# Patient Record
Sex: Male | Born: 1990 | Race: Black or African American | Hispanic: No | Marital: Single | Smoking: Never smoker
Health system: Southern US, Community
[De-identification: ages and names within clinical notes are randomized; demographics above are authoritative.]

## PROBLEM LIST (undated history)

## (undated) HISTORY — PX: APPENDECTOMY: SHX54

---
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
## 2011-01-27 LAB — BASIC METABOLIC PANEL
Calcium: 9.6 mg/dL (ref 8.4–10.5)
Potassium: 4.6 mEq/L (ref 3.5–5.1)
Sodium: 139 mEq/L (ref 135–145)

## 2011-01-27 LAB — CBC
HCT: 44 % (ref 36.0–49.0)
Hemoglobin: 14.6 g/dL (ref 12.0–16.0)
RBC: 5.38 MIL/uL (ref 3.80–5.70)
RDW: 15.1 % (ref 11.4–15.5)
WBC: 8.2 10*3/uL (ref 4.5–13.5)

## 2011-01-27 LAB — DIFFERENTIAL
Basophils Absolute: 0 10*3/uL (ref 0.0–0.1)
Lymphocytes Relative: 43 % (ref 24–48)
Lymphs Abs: 3.5 10*3/uL (ref 1.1–4.8)
Monocytes Absolute: 0.5 10*3/uL (ref 0.2–1.2)
Neutro Abs: 4 10*3/uL (ref 1.7–8.0)

## 2011-01-30 LAB — COMPREHENSIVE METABOLIC PANEL
ALT: 18 U/L (ref 0–53)
AST: 32 U/L (ref 0–37)
Albumin: 2.6 g/dL — ABNORMAL LOW (ref 3.5–5.2)
Alkaline Phosphatase: 100 U/L (ref 52–171)
BUN: 12 mg/dL (ref 6–23)
CO2: 25 mEq/L (ref 19–32)
Calcium: 9.2 mg/dL (ref 8.4–10.5)
Chloride: 93 mEq/L — ABNORMAL LOW (ref 96–112)
Creatinine, Ser: 1.06 mg/dL (ref 0.4–1.5)
Glucose, Bld: 132 mg/dL — ABNORMAL HIGH (ref 70–99)
Potassium: 5.7 mEq/L — ABNORMAL HIGH (ref 3.5–5.1)
Sodium: 131 mEq/L — ABNORMAL LOW (ref 135–145)
Total Bilirubin: 1.9 mg/dL — ABNORMAL HIGH (ref 0.3–1.2)
Total Protein: 7.2 g/dL (ref 6.0–8.3)

## 2011-01-30 LAB — BASIC METABOLIC PANEL
BUN: 13 mg/dL (ref 6–23)
CO2: 27 mEq/L (ref 19–32)
Calcium: 9 mg/dL (ref 8.4–10.5)
Chloride: 99 mEq/L (ref 96–112)
Creatinine, Ser: 1.19 mg/dL (ref 0.4–1.5)
Glucose, Bld: 145 mg/dL — ABNORMAL HIGH (ref 70–99)
Potassium: 4.3 mEq/L (ref 3.5–5.1)
Sodium: 138 mEq/L (ref 135–145)

## 2011-01-30 LAB — CBC
HCT: 33.8 % — ABNORMAL LOW (ref 36.0–49.0)
HCT: 34.6 % — ABNORMAL LOW (ref 36.0–49.0)
HCT: 36.2 % (ref 36.0–49.0)
HCT: 36.7 % (ref 36.0–49.0)
HCT: 38.8 % (ref 36.0–49.0)
HCT: 42.6 % (ref 36.0–49.0)
HCT: 44.2 % (ref 36.0–49.0)
Hemoglobin: 11.4 g/dL — ABNORMAL LOW (ref 12.0–16.0)
Hemoglobin: 11.6 g/dL — ABNORMAL LOW (ref 12.0–16.0)
Hemoglobin: 11.7 g/dL — ABNORMAL LOW (ref 12.0–16.0)
Hemoglobin: 12.2 g/dL (ref 12.0–16.0)
Hemoglobin: 13.1 g/dL (ref 12.0–16.0)
Hemoglobin: 14.4 g/dL (ref 12.0–16.0)
Hemoglobin: 14.7 g/dL (ref 12.0–16.0)
MCHC: 33.2 g/dL (ref 31.0–37.0)
MCHC: 33.7 g/dL (ref 31.0–37.0)
MCHC: 33.8 g/dL (ref 31.0–37.0)
MCHC: 33.8 g/dL (ref 31.0–37.0)
MCHC: 33.8 g/dL (ref 31.0–37.0)
MCHC: 34.2 g/dL (ref 31.0–37.0)
MCV: 78.7 fL (ref 78.0–98.0)
MCV: 79.4 fL (ref 78.0–98.0)
MCV: 79.7 fL (ref 78.0–98.0)
MCV: 79.8 fL (ref 78.0–98.0)
MCV: 80.3 fL (ref 78.0–98.0)
MCV: 80.4 fL (ref 78.0–98.0)
Platelets: 280 10*3/uL (ref 150–400)
Platelets: 284 10*3/uL (ref 150–400)
Platelets: 318 10*3/uL (ref 150–400)
Platelets: 350 10*3/uL (ref 150–400)
RBC: 5.42 MIL/uL (ref 3.80–5.70)
RBC: 5.57 MIL/uL (ref 3.80–5.70)
RDW: 14.1 % (ref 11.4–15.5)
RDW: 14.2 % (ref 11.4–15.5)
RDW: 14.3 % (ref 11.4–15.5)
RDW: 14.3 % (ref 11.4–15.5)
RDW: 14.4 % (ref 11.4–15.5)
RDW: 14.6 % (ref 11.4–15.5)
RDW: 14.7 % (ref 11.4–15.5)
RDW: 14.7 % (ref 11.4–15.5)
WBC: 16.9 10*3/uL — ABNORMAL HIGH (ref 4.5–13.5)
WBC: 21.1 10*3/uL — ABNORMAL HIGH (ref 4.5–13.5)
WBC: 23 10*3/uL — ABNORMAL HIGH (ref 4.5–13.5)

## 2011-01-30 LAB — TYPE AND SCREEN
ABO/RH(D): A POS
Antibody Screen: NEGATIVE

## 2011-01-30 LAB — ANAEROBIC CULTURE

## 2011-01-30 LAB — DIFFERENTIAL
Basophils Absolute: 0.1 10*3/uL (ref 0.0–0.1)
Basophils Absolute: 0.3 10*3/uL — ABNORMAL HIGH (ref 0.0–0.1)
Basophils Relative: 0 % (ref 0–1)
Eosinophils Absolute: 0 10*3/uL (ref 0.0–1.2)
Eosinophils Relative: 0 % (ref 0–5)
Eosinophils Relative: 1 % (ref 0–5)
Lymphocytes Relative: 14 % — ABNORMAL LOW (ref 24–48)
Lymphocytes Relative: 8 % — ABNORMAL LOW (ref 24–48)
Lymphs Abs: 1.9 10*3/uL (ref 1.1–4.8)
Lymphs Abs: 3.1 10*3/uL (ref 1.1–4.8)
Monocytes Absolute: 1.5 10*3/uL — ABNORMAL HIGH (ref 0.2–1.2)
Monocytes Absolute: 2.1 10*3/uL — ABNORMAL HIGH (ref 0.2–1.2)
Monocytes Relative: 7 % (ref 3–11)
Monocytes Relative: 9 % (ref 3–11)
Neutro Abs: 18.9 10*3/uL — ABNORMAL HIGH (ref 1.7–8.0)
Neutrophils Relative %: 82 % — ABNORMAL HIGH (ref 43–71)

## 2011-01-30 LAB — ABO/RH: ABO/RH(D): A POS

## 2011-01-30 LAB — CULTURE, ROUTINE-ABSCESS: Culture: NO GROWTH

## 2011-01-30 LAB — LIPASE, BLOOD: Lipase: 74 U/L — ABNORMAL HIGH (ref 11–59)

## 2011-03-08 NOTE — H&P (Signed)
Brian Fry, Brian Fry                 ACCOUNT NO.:  [id number]   MEDICAL RECORD NO.:  [id number]          PATIENT TYPE:  INP   LOCATION:  6120                         FACILITY:  [facility]   PHYSICIAN:  Lennie Muckle, MD      DATE OF BIRTH:  1991-09-06   DATE OF ADMISSION:  04/29/2009  DATE OF DISCHARGE:                              HISTORY & PHYSICAL   DIAGNOSIS:  Perforated appendicitis.   HISTORY OF PRESENT ILLNESS:  Brian Fry is a 20 year old male who  apparently had onset of abdominal pain, right lower quadrant on Friday.  He had nausea and vomiting.  He went to [facility], had a CBC  count of 18,000, was diagnosed with a gastroenteritis, and sent home  with ibuprofen.  He felt no better over the weekend and had continued  nausea and vomiting on Saturday.  He felt somewhat better on Monday,  continued to have abdominal pain on Tuesday with nausea and vomiting,  went back to primary care physician today where he had a CBC performed,  which showed a white count of 19,000, was sent for CT scan, which did  reveal large abscess collection in the pelvis measuring 14 x 10 x 10.  He has had chills at home, continues to have abdominal pain mostly in  the right lower quadrant.  He says it is worse with movement.  He has  had diarrhea for the past few days.  He had been around his relative who  has been sick.  He is occasionally on medications of reflux at home.  He  has had no surgeries and he has had no previous hospitalizations.   FAMILY HISTORY:  Negative.   REVIEW OF SYSTEMS:  Negative.   PHYSICAL EXAMINATION:  GENERAL:  He is a pleasant young male in no acute  distress.  VITAL SIGNS:  Blood pressure is 134/112, pulse is 132, temperature 98.  He is not acutely ill appearing.  HEENT:  Mucous membranes are moist.  Sclerae are clear.  CHEST:  Clear to auscultation bilaterally.  CARDIOVASCULAR:  Tachycardia.  ABDOMEN:  Obese.  He is tender more so in the right lower quadrant  than  the left lower quadrant.  No rebound tenderness.  No frank peritonitis  noted.  SKIN:  Without rashes.  MUSCULOSKELETAL:  No edema is noted.   CT scan is reviewed.  There are small pockets of air.  There is a large  fluid collection in the pelvis as well as smaller air-fluid collection  in the left side of the abdomen.  Appendix is not well visualized.   ASSESSMENT AND PLAN:  Most likely perforated appendicitis with large  abscess.  I talked with Brian Fry and his father who was present in the  room the options of,  1. Either admitting with IV antibiotics and percutaneous drainage to      allow inflammation to come down, possibly do a delayed appendectomy      at a later date.  2. Go ahead and proceed to the operating room.  If it is a perforated  appendicitis, it could be at the base of the appendix requiring a      cecectomy and a larger procedure.  Given the fact that he has not      had frank peritonitis and he feels somewhat better after his      emesis, we will go ahead and place him on Zosyn, have Radiology      drain      his abscess tomorrow.  If he acutely changes that tonight, I have      instructed them to call me and we will go ahead and proceed to the      operating room.  All questions have been answered and hopefully, he      will be able to come through this not requiring an acute emergency      surgery.      Lennie Muckle, MD  Electronically Signed     ALA/MEDQ  D:  04/29/2009  T:  04/30/2009  Job:  [id number]

## 2011-03-08 NOTE — Discharge Summary (Signed)
Brian Fry, MCGLOCKLIN                 ACCOUNT NO.:  [id number]   MEDICAL RECORD NO.:  [id number]          PATIENT TYPE:  INP   LOCATION:  6120                         FACILITY:  [facility]   PHYSICIAN:  Lennie Muckle, MD      DATE OF BIRTH:  1990-11-29   DATE OF ADMISSION:  04/29/2009  DATE OF DISCHARGE:  05/08/2009                               DISCHARGE SUMMARY   CHIEF COMPLAINT/REASON FOR ADMISSION:  Mr. Brian Fry is a 20 year old male  patient who was admitted with perforated appendicitis.  He had abdominal  lower quadrant pain beginning the Friday before he was admitted, this  was associated nausea and vomiting.  He had a white count of 18,000,  initially this was felt to be gastroenteritis.  The patient did not  improve.  The white count increased to 19,000.  The patient was  subsequently sent for CT scan, which revealed a large abscess collection  in the pelvis measuring 14 x 10 x 10 cm.  He had chills at home and  having right lower quadrant abdominal pain.   PHYSICAL EXAMINATION:  VITAL SIGNS:  His initial blood pressure was  134/112 and pulse 132.  He was afebrile.  ABDOMEN:  Obese.  Tender in the right lower quadrant without rebounding  or guarding.   CT was refute reviewed with the radiologist per Dr. Freida Busman and it was  determined the patient had a perforated appendix with a very large  abscess collection.   ADMITTING DIAGNOSIS:  A perforated appendicitis with subsequent large  abdominopelvic abscess.   HOSPITAL COURSE:  The patient was admitted to the pediatric floor, made  n.p.o. status with plans to proceed with possible percutaneous drainage  of abscess within the first 24 hours.  He was empirically started on IV  Zosyn.   On April 30, 2009, the patient went to Interventional Radiology where a 14-  French drain was placed into pelvic fluid collection with a large amount  of fluid obtained.  Subsequent cultures, preliminaries were abundant  Gram-negative rods, Gram-positive  rods, and Gram-positive cocci.  Final  culture was for Staph aureus with resistant to penicillin.  Leukocytosis  persisted.  Pain had resolved.  The patient was tolerating diet and  remained on Zosyn.  Follow up CTs were done between May 03, 2009, and  May 04, 2009, which showed that the central fluid collection itself was  much smaller, but still had some fluid in the pelvis, therefore the  patient was sent back for second percutaneous drain procedure by IR,  this was done on May 05, 2009.  A 12-French drain was inserted in the  right lower quadrant abscess region and serous fluid was obtained and  subsequent cultures for this showed no growth to date.   At this point, the patient was continued on IV antibiotics.  Diet was  slowly advanced.  He was continued to be monitored.  His leukocytosis  slowly improved.  His pain had resolved and by date of discharge, May 08, 2009; he averaged about 30-60 mL out of each drain with the first  drain still having purulent drainage and second drain still with serous;  24 hours prior, he had been switched over to p.o. amoxicillin and Flagyl  q.i.d.  Plans were to send the patient home on this regimen for at least  2 weeks, although at time of dictation Dr. Zachery Dakins has informed me  that he is going to speak with Infectious Diseases and reviewed the  cultures with him to make sure he is on appropriate antibiotic therapy.  Antibiotic regimen may change prior to discharge and this will be noted  on the patient's pink discharge sheet.  On date of discharge, the  patient was afebrile, vital signs were stable.  He was tolerating a  solid diet and tolerating the oral antibiotics without any  gastrointestinal symptoms.   His white count was 8700, hemoglobin 0.6, and platelets 425,000.   Abdomen was soft and nontender.  The bags that had been placed by IR to  each drain tubing had been changed over to grenade type JP bulb per Dr.  Zachery Dakins his  request.  Dr. Zachery Dakins also spent an extensive amount of  time talking with the patient and his parents regarding expectations  after discharge.  Main plan is for the patient to follow up with Dr.  Johna Sheriff within a week at the office.  He needs to have blood work drawn  prior to seeing Dr. Johna Sheriff, i.e., CBC and a BMET because the lab is  now requiring a specific lab form that is not available at the hospital.  I will have to call the office and have them arranged, both the lab  appointment and follow up appointment and faxing the paperwork to  [facility] as indicated.  In regards to when to repeat a CT;  Interventional Radiology has recommended when output for each drain is  down to 10 mL in a 24-hour period.  It would be time to repeat a CT scan  otherwise continue to monitor drain output and the patient's overall  status.   FINAL DISCHARGE DIAGNOSES:  1. Abdominal pain.  2. Fever.  3. Leukocytosis, secondary to perforated appendicitis with very large      abscess in right abdomen and pelvis.  4. Status post percutaneous drainage of fluid collections/abscess in      abdomen and pelvis x2 with culture of initial drainage positive for      Staph.   DISCHARGE MEDICATIONS:  The patient was not on medications prior to  admission.  He will be on the following medications at discharge:  1. Amoxicillin 500 mg 4 times daily.  2. Flagyl 500 mg 4 times daily.  3. Vicodin 5/325 one to two tablets every 4 hours as needed for pain.   As previously discussed Dr. Zachery Dakins will be speaking with Infectious  Diseases and antibiotic therapy may need to change based on their  recommendation.   DIET:  No restrictions.   Return to school or work in 2 weeks, note has been given.   WOUND CARE:  Home health RN to assist with drain care.  The patient has  been instructed to empty and record amount for each drain daily and  bring to MD visits.   ACTIVITY:  Basically, no restrictions except as  listed in the note.  He  may shower.   FOLLOWUP APPOINTMENTS:  He needs to be seen by Dr. Johna Sheriff in 1 week.  Again, we will have the office call him since he also needs blood work  obtained prior to  that visit.      Allison L. Kennith Center, MD  Electronically Signed    ALE/MEDQ  D:  05/08/2009  T:  05/08/2009  Job:  [id number]   cc:   Lorne Skeens. Hoxworth, M.D.

## 2012-06-08 ENCOUNTER — Emergency Department (HOSPITAL_COMMUNITY)
Admission: EM | Admit: 2012-06-08 | Discharge: 2012-06-08 | Disposition: A | Discharge status: Home / Self Care | Payer: No Typology Code available for payment source | Attending: Emergency Medicine | Admitting: Emergency Medicine

## 2012-06-08 ENCOUNTER — Encounter (HOSPITAL_COMMUNITY): Payer: Self-pay | Admitting: *Deleted

## 2012-06-08 DIAGNOSIS — Z043 Encounter for examination and observation following other accident: Secondary | ICD-10-CM | POA: Insufficient documentation

## 2012-06-08 MED ORDER — HYDROCODONE-ACETAMINOPHEN 5-500 MG PO TABS: 1.0000 | ORAL_TABLET | Freq: Four times a day (QID) | ORAL | Status: AC | PRN

## 2012-06-08 MED ORDER — CYCLOBENZAPRINE HCL 5 MG PO TABS: 5.0000 mg | ORAL_TABLET | Freq: Two times a day (BID) | ORAL | Status: AC | PRN
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
## 2012-06-08 NOTE — ED Provider Notes (Signed)
Medical screening examination/treatment/procedure(s) were performed by non-physician practitioner and as supervising physician I was immediately available for consultation/collaboration.    Nelia Shi, MD 06/08/12 [phone]
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
## 2014-01-23 ENCOUNTER — Emergency Department (HOSPITAL_COMMUNITY): Payer: Self-pay

## 2014-01-23 ENCOUNTER — Encounter (HOSPITAL_COMMUNITY): Payer: Self-pay | Admitting: Emergency Medicine

## 2014-01-23 ENCOUNTER — Emergency Department (HOSPITAL_COMMUNITY)
Admission: EM | Admit: 2014-01-23 | Discharge: 2014-01-24 | Disposition: A | Discharge status: Home / Self Care | Payer: Self-pay | Attending: Emergency Medicine | Admitting: Emergency Medicine

## 2014-01-23 DIAGNOSIS — Y9367 Activity, basketball: Secondary | ICD-10-CM | POA: Insufficient documentation

## 2014-01-23 DIAGNOSIS — S9306XA Dislocation of unspecified ankle joint, initial encounter: Secondary | ICD-10-CM | POA: Insufficient documentation

## 2014-01-23 DIAGNOSIS — W219XXA Striking against or struck by unspecified sports equipment, initial encounter: Secondary | ICD-10-CM | POA: Insufficient documentation

## 2014-01-23 DIAGNOSIS — Y92838 Other recreation area as the place of occurrence of the external cause: Secondary | ICD-10-CM

## 2014-01-23 DIAGNOSIS — S9304XA Dislocation of right ankle joint, initial encounter: Secondary | ICD-10-CM

## 2014-01-23 DIAGNOSIS — Y9239 Other specified sports and athletic area as the place of occurrence of the external cause: Secondary | ICD-10-CM | POA: Insufficient documentation

## 2014-01-23 MED ORDER — OXYCODONE-ACETAMINOPHEN 5-325 MG PO TABS
2.0000 | ORAL_TABLET | Freq: Once | ORAL | Status: AC
  Administered 2014-01-23: 2 via ORAL
  Filled 2014-01-23: qty 2

## 2014-01-23 MED ORDER — PROPOFOL 10 MG/ML IV EMUL
INTRAVENOUS | Status: AC
  Filled 2014-01-23: qty 100

## 2014-01-23 MED ORDER — PROPOFOL 10 MG/ML IV BOLUS
INTRAVENOUS | Status: DC
Start: 2014-01-23 — End: 2014-01-24
  Filled 2014-01-23: qty 1

## 2014-01-23 MED ORDER — SODIUM CHLORIDE 0.9 % IV BOLUS (SEPSIS)
500.0000 mL | Freq: Once | INTRAVENOUS | Status: AC
  Administered 2014-01-23: 500 mL via INTRAVENOUS

## 2014-01-23 MED ORDER — FENTANYL CITRATE 0.05 MG/ML IJ SOLN
INTRAMUSCULAR | Status: AC | PRN
  Administered 2014-01-23: 100 ug via INTRAVENOUS

## 2014-01-23 MED ORDER — FENTANYL CITRATE 0.05 MG/ML IJ SOLN
100.0000 ug | Freq: Once | INTRAMUSCULAR | Status: DC
  Filled 2014-01-23: qty 2

## 2014-01-23 MED ORDER — PROPOFOL 10 MG/ML IV BOLUS
INTRAVENOUS | Status: AC | PRN
  Administered 2014-01-23: 40 mg via INTRAVENOUS
  Administered 2014-01-23 (×2): 60 mg via INTRAVENOUS
  Administered 2014-01-23: 20 mg via INTRAVENOUS

## 2014-01-23 MED ORDER — PROPOFOL 10 MG/ML IV BOLUS
INTRAVENOUS | Status: AC
  Filled 2014-01-23: qty 1

## 2014-01-23 NOTE — ED Notes (Signed)
Per PA, conscious sedation to be performed
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
## 2014-01-23 NOTE — ED Notes (Signed)
Pt reports that he was playing basketball and fell on his R ankle, deformity noted, reports that he has injured this ankle before. Pt a&o x4, able to assist with movement to wheelchair and bed at this time

## 2014-01-23 NOTE — ED Provider Notes (Signed)
Medical screening examination/treatment/procedure(s) were conducted as a shared visit with non-physician practitioner(s) and myself.  I personally evaluated the patient during the encounter.  Airway patent and maintained lungs clear to auscultation cardiac mild tachycardia Right foot dorsalis pedis pulse intact CR<2 seconds toes with slight decreased light-touch preprocedure with some slight numbness and paresthesias of his entire foot with right ankle closed subtalar dislocation  Preprocedure: Closed reduction right ankle subtalar dislocation with titrated propofol deep sedation   Pre-anesthesia/induction confirmation of laterality/correct procedure site including "time-out."  Provider confirms review of the nurses' note, allergies, medications, pertinent labs, PMH, pre-induction vital signs, pulse oximetry, pain level, and ECG (as applicable), and patient condition satisfactory for commencing with order for sedation and procedure.  Procedure Note: ICG, time-out taken, right ankle subtalar dislocation, using titrated propofol patient was sedated and closed reduction right ankle subtalar dislocation one attempt performed by myself; patient tolerated the procedure well without apparent immediate complications unassisted orthopedic technician and applying posterior and stirrup short leg splint ; patient has capillary refill less than 2 seconds was able to wiggle his toes well, had decreased paresthesias and decreased numbness with improvement of pain and sensation after procedure   Patient tolerated procedure and procedural sedation component as expected without apparent immediate complications.  Physician confirms procedural medication orders as administered, patient was assessed by physician post-procedure, and confirms post-sedation plan of care and disposition. [id number]   Hurman HornJohn M Braylyn Eye, MD 01/24/14 [phone]

## 2014-01-23 NOTE — ED Notes (Signed)
Patient transported to X-ray 

## 2014-01-24 ENCOUNTER — Emergency Department (HOSPITAL_COMMUNITY): Payer: Self-pay

## 2014-01-24 MED ORDER — OXYCODONE-ACETAMINOPHEN 5-325 MG PO TABS: 1.0000 | ORAL_TABLET | ORAL | Status: DC | PRN

## 2014-01-24 NOTE — ED Notes (Signed)
Patient transported to CT 

## 2014-01-24 NOTE — Discharge Instructions (Signed)
Take the prescribed medication as directed. Follow-up with Dr. Carola FrostHandy next week on Monday or Wednesday-- call his office to schedule appt time. Leave splint in place until follow-up appt. Return to the ED for new or worsening symptoms.

## 2014-12-22 ENCOUNTER — Encounter (HOSPITAL_COMMUNITY): Payer: Self-pay | Admitting: Emergency Medicine

## 2014-12-22 ENCOUNTER — Emergency Department (HOSPITAL_COMMUNITY)
Admission: EM | Admit: 2014-12-22 | Discharge: 2014-12-23 | Discharge status: Against Medical Advice | Payer: No Typology Code available for payment source | Attending: Emergency Medicine | Admitting: Emergency Medicine

## 2014-12-22 ENCOUNTER — Emergency Department (HOSPITAL_COMMUNITY): Payer: No Typology Code available for payment source

## 2014-12-22 DIAGNOSIS — Y92410 Unspecified street and highway as the place of occurrence of the external cause: Secondary | ICD-10-CM | POA: Diagnosis not present

## 2014-12-22 DIAGNOSIS — Y9389 Activity, other specified: Secondary | ICD-10-CM | POA: Insufficient documentation

## 2014-12-22 DIAGNOSIS — S99911A Unspecified injury of right ankle, initial encounter: Secondary | ICD-10-CM | POA: Insufficient documentation

## 2014-12-22 DIAGNOSIS — Y998 Other external cause status: Secondary | ICD-10-CM | POA: Diagnosis not present

## 2014-12-22 DIAGNOSIS — M25571 Pain in right ankle and joints of right foot: Secondary | ICD-10-CM
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
## 2014-12-22 NOTE — ED Notes (Signed)
XR called and said patient refused to have Xrays done
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
## 2014-12-23 NOTE — Discharge Instructions (Signed)
Please call your doctor for a followup appointment within 24-48 hours. When you talk to your doctor please let them know that you were seen in the emergency department and have them acquire all of your records so that they can discuss the findings with you and formulate a treatment plan to fully care for your new and ongoing problems. Please follow-up with your primary care provider Please follow-up with orthopedics If you decide to change your mind within the next 48 hours, please report back to emergency department for imaging of the right ankle to be performed at any time Please keep ankle brace placed for all purposes and at all times Please rest, ice, elevate-toes above nose Please continue to monitor symptoms closely and if symptoms are to worsen or change (fever greater than 101, chills, sweating, nausea, vomiting, chest pain, shortness of breathe, difficulty breathing, weakness, numbness, tingling, worsening or changes to pain pattern, fall, injury, loss of sensation, changes to skin color) please report back to the Emergency Department immediately.    Arthralgia Your caregiver has diagnosed you as suffering from an arthralgia. Arthralgia means there is pain in a joint. This can come from many reasons including:  Bruising the joint which causes soreness (inflammation) in the joint.  Wear and tear on the joints which occur as we grow older (osteoarthritis).  Overusing the joint.  Various forms of arthritis.  Infections of the joint. Regardless of the cause of pain in your joint, most of these different pains respond to anti-inflammatory drugs and rest. The exception to this is when a joint is infected, and these cases are treated with antibiotics, if it is a bacterial infection. HOME CARE INSTRUCTIONS   Rest the injured area for as long as directed by your caregiver. Then slowly start using the joint as directed by your caregiver and as the pain allows. Crutches as directed may be  useful if the ankles, knees or hips are involved. If the knee was splinted or casted, continue use and care as directed. If an stretchy or elastic wrapping bandage has been applied today, it should be removed and re-applied every 3 to 4 hours. It should not be applied tightly, but firmly enough to keep swelling down. Watch toes and feet for swelling, bluish discoloration, coldness, numbness or excessive pain. If any of these problems (symptoms) occur, remove the ace bandage and re-apply more loosely. If these symptoms persist, contact your caregiver or return to this location.  For the first 24 hours, keep the injured extremity elevated on pillows while lying down.  Apply ice for 15-20 minutes to the sore joint every couple hours while awake for the first half day. Then 03-04 times per day for the first 48 hours. Put the ice in a plastic bag and place a towel between the bag of ice and your skin.  Wear any splinting, casting, elastic bandage applications, or slings as instructed.  Only take over-the-counter or prescription medicines for pain, discomfort, or fever as directed by your caregiver. Do not use aspirin immediately after the injury unless instructed by your physician. Aspirin can cause increased bleeding and bruising of the tissues.  If you were given crutches, continue to use them as instructed and do not resume weight bearing on the sore joint until instructed. Persistent pain and inability to use the sore joint as directed for more than 2 to 3 days are warning signs indicating that you should see a caregiver for a follow-up visit as soon as possible. Initially, a  hairline fracture (break in bone) may not be evident on X-rays. Persistent pain and swelling indicate that further evaluation, non-weight bearing or use of the joint (use of crutches or slings as instructed), or further X-rays are indicated. X-rays may sometimes not show a small fracture until a week or 10 days later. Make a follow-up  appointment with your own caregiver or one to whom we have referred you. A radiologist (specialist in reading X-rays) may read your X-rays. Make sure you know how you are to obtain your X-ray results. Do not assume everything is normal if you do not hear from us. SEEK MEDICAL CARE IF: Bruising, swelling, or pain increases. SEEK IMMEDIATE MEDICAL CARE IF:   Your fingers or toes are numb or blue.  The pain is not responding to medications and continues to stay the same or get worse.  The pain in your joint becomes severe.  You develop a fever over 102 F (38.9 C).  It becomes impossible to move or use the joint. MAKE SURE YOU:   Understand these instructions.  Will watch your condition.  Will get help right away if you are not doing well or get worse. Document Released: 10/10/2005 Document Revised: 01/02/2012 Document Reviewed: 05/28/2008 [facility] Patient Information 2015 [employer], [place]. This information is not intended to replace advice given to you by your health care provider. Make sure you discuss any questions you have with your health care provider.

## 2019-09-30 ENCOUNTER — Other Ambulatory Visit: Payer: Self-pay

## 2019-09-30 DIAGNOSIS — Z20822 Contact with and (suspected) exposure to covid-19: Secondary | ICD-10-CM

## 2019-10-01 LAB — NOVEL CORONAVIRUS, NAA: SARS-CoV-2, NAA: NOT DETECTED

## 2022-08-12 ENCOUNTER — Ambulatory Visit (INDEPENDENT_AMBULATORY_CARE_PROVIDER_SITE_OTHER): Payer: 59

## 2022-08-12 ENCOUNTER — Other Ambulatory Visit: Payer: Self-pay

## 2022-08-12 ENCOUNTER — Ambulatory Visit
Admission: EM | Admit: 2022-08-12 | Discharge: 2022-08-12 | Disposition: A | Discharge status: Home / Self Care | Payer: 59 | Attending: Emergency Medicine | Admitting: Emergency Medicine

## 2022-08-12 DIAGNOSIS — S63501A Unspecified sprain of right wrist, initial encounter: Secondary | ICD-10-CM

## 2022-08-12 DIAGNOSIS — M25531 Pain in right wrist: Secondary | ICD-10-CM

## 2022-08-12 MED ORDER — IBUPROFEN 800 MG PO TABS
800.0000 mg | ORAL_TABLET | Freq: Once | ORAL | Status: AC
  Administered 2022-08-12: 800 mg via ORAL

## 2022-08-12 MED ORDER — IBUPROFEN 600 MG PO TABS: 600.0000 mg | ORAL_TABLET | Freq: Three times a day (TID) | ORAL | 0 refills | Status: AC | PRN

## 2022-08-12 NOTE — ED Provider Notes (Signed)
[facility]    CSN: [id number] Arrival date & time: 08/12/22  1845    HISTORY   Chief Complaint  Patient presents with   Wrist Pain   HPI Brian Fry is a pleasant, 31 y.o. male who presents to urgent care today. The pt complains of a 1 day history of limited movement to his right wrist secondary to pain.  Patient states he injured his right wrist while playing basketball, states he slipped and fell on the possible court landing on his right hand.  Patient states he has been taking Tylenol with no meaningful relief of symptoms.  Of note, x-ray of his right wrist on arrival today did not reveal any acute findings.  The history is provided by the patient.   History reviewed. No pertinent past medical history. There are no problems to display for this patient.  Past Surgical History:  Procedure Laterality Date   APPENDECTOMY      Home Medications    Prior to Admission medications   Medication Sig Start Date End Date Taking? Authorizing Provider  acetaminophen (TYLENOL) 325 MG tablet Take 650 mg by mouth every 6 (six) hours as needed. Pain.    [provider]  oxyCODONE-acetaminophen (PERCOCET/ROXICET) 5-325 MG per tablet Take 1 tablet by mouth every 4 (four) hours as needed. 01/24/14   Garlon Hatchet, PA-C    Family History History reviewed. No pertinent family history. Social History Social History   Tobacco Use   Smoking status: Never  Substance Use Topics   Alcohol use: Yes   Drug use: No   Allergies   Patient has no known allergies.  Review of Systems Review of Systems Pertinent findings revealed after performing a 14 point review of systems has been noted in the history of present illness.  Physical Exam Triage Vital Signs ED Triage Vitals  Enc Vitals Group     BP 08/20/21 0827 (!) 147/82     Pulse Rate 08/20/21 0827 72     Resp 08/20/21 0827 18     Temp 08/20/21 0827 98.3 F (36.8 C)     Temp Source 08/20/21 0827 Oral     SpO2  08/20/21 0827 98 %     Weight --      Height --      Head Circumference --      Peak Flow --      Pain Score 08/20/21 0826 5     Pain Loc --      Pain Edu? --      Excl. in GC? --    Updated Vital Signs BP (!) 159/99 (BP Location: Right Arm)   Pulse 84   Temp 98 F (36.7 C) (Oral)   Resp 16   SpO2 96%   Physical Exam Vitals and nursing note reviewed.  Constitutional:      General: He is not in acute distress.    Appearance: Normal appearance. He is normal weight. He is not ill-appearing.  HENT:     Head: Normocephalic and atraumatic.  Eyes:     Extraocular Movements: Extraocular movements intact.     Conjunctiva/sclera: Conjunctivae normal.     Pupils: Pupils are equal, round, and reactive to light.  Cardiovascular:     Rate and Rhythm: Normal rate and regular rhythm.  Pulmonary:     Effort: Pulmonary effort is normal.     Breath sounds: Normal breath sounds.  Musculoskeletal:     Right wrist: Swelling and tenderness present. No deformity,  effusion, lacerations, bony tenderness, snuff box tenderness or crepitus. Decreased range of motion. Normal pulse.     Right hand: Decreased strength of finger abduction, thumb/finger opposition and wrist extension.     Cervical back: Normal range of motion and neck supple.  Skin:    General: Skin is warm and dry.  Neurological:     General: No focal deficit present.     Mental Status: He is alert and oriented to person, place, and time. Mental status is at baseline.  Psychiatric:        Mood and Affect: Mood normal.        Behavior: Behavior normal.        Thought Content: Thought content normal.        Judgment: Judgment normal.     [facility] / Diagnostics / Procedures:     Radiology DG Wrist Complete Right  Result Date: 08/12/2022 CLINICAL DATA:  Trauma to the right wrist. EXAM: RIGHT WRIST - COMPLETE 3+ VIEW COMPARISON:  None Available. FINDINGS: There is no evidence of fracture or dislocation. There is no evidence of  arthropathy or other focal bone abnormality. Soft tissues are unremarkable. IMPRESSION: Negative. Electronically Signed   By: Elgie Collard M.D.   On: 08/12/2022 19:10    Procedures Procedures (including critical care time) EKG  Pending results:  Labs Reviewed - No data to display  Medications Ordered in UC: Medications  ibuprofen (ADVIL) tablet 800 mg (has no administration in time range)    UC Diagnoses / Final Clinical Impressions(s)   I have reviewed the triage vital signs and the nursing notes.  Pertinent labs & imaging results that were available during my care of the patient were reviewed by me and considered in my medical decision making (see chart for details).    Final diagnoses:  Right wrist sprain, initial encounter   Patient advised of x-ray findings.  Patient provided with a thumb spica splint which I have advised him to wear for the next 7 to 10 days.  The thumb spica splint ordered actually will also provide him with good wrist support.  Patient provided with a prescription for ibuprofen 600 mg 3 times daily as needed for pain.  Patient advised to follow-up with orthopedics in the next 7 to 10 days if not seeing meaningful improvement of his pain and strength in his right wrist.  ED Prescriptions     Medication Sig Dispense Auth. Provider   ibuprofen (ADVIL) 600 MG tablet Take 1 tablet (600 mg total) by mouth every 8 (eight) hours as needed for up to 30 doses for fever, headache, mild pain or moderate pain (Inflammation). Take 1 tablet 3 times daily as needed for inflammation of upper airways and/or pain. 30 tablet Theadora Rama Scales, PA-C      PDMP not reviewed this encounter.  Discharge Instructions:   Discharge Instructions      I have included some information about wrist sprains and rehabilitation from sprains in your after visit summary today.  I hope you find this helpful.  I have sent a prescription for ibuprofen 600 mg to your pharmacy that you  can take 3 times daily as needed for pain.  Please wear the splint that we provided for you today at all times except when you need to get your hands wet.  For the next week to 10 days, please monitor how well your pain is improving and the strength of your hand.  If you feel that you are not  improving, I recommend that you follow-up with orthopedics.  More advanced imaging such as ultrasound or MRI may be indicated.  Thank you for visiting urgent care today.      Disposition Upon Discharge:  Condition: stable for discharge home Home: take medications as prescribed; routine discharge instructions as discussed; follow up as advised.  Patient presented with an acute illness with associated systemic symptoms and significant discomfort requiring urgent management. In my opinion, this is a condition that a prudent lay person (someone who possesses an average knowledge of health and medicine) may potentially expect to result in complications if not addressed urgently such as respiratory distress, impairment of bodily function or dysfunction of bodily organs.   Routine symptom specific, illness specific and/or disease specific instructions were discussed with the patient and/or caregiver at length.   As such, the patient has been evaluated and assessed, work-up was performed and treatment was provided in alignment with urgent care protocols and evidence based medicine.  Patient/parent/caregiver has been advised that the patient may require follow up for further testing and treatment if the symptoms continue in spite of treatment, as clinically indicated and appropriate.  Patient/parent/caregiver has been advised to report to orthopedic urgent care clinic or return to the [facility] or PCP in 3-5 days if no better; follow-up with orthopedics, PCP or the Emergency Department if new signs and symptoms develop or if the current signs or symptoms continue to change or worsen for further workup, evaluation and  treatment as clinically indicated and appropriate  The patient will follow up with their current PCP if and as advised. If the patient does not currently have a PCP we will have assisted them in obtaining one.   The patient may need specialty follow up if the symptoms continue, in spite of conservative treatment and management, for further workup, evaluation, consultation and treatment as clinically indicated and appropriate.  Patient/parent/caregiver verbalized understanding and agreement of plan as discussed.  All questions were addressed during visit.  Please see discharge instructions below for further details of plan.  This office note has been dictated using Museum/gallery curator.  Unfortunately, this method of dictation can sometimes lead to typographical or grammatical errors.  I apologize for your inconvenience in advance if this occurs.  Please do not hesitate to reach out to me if clarification is needed.      Lynden Oxford Scales, PA-C 08/12/22 2014

## 2022-08-12 NOTE — Discharge Instructions (Signed)
I have included some information about wrist sprains and rehabilitation from sprains in your after visit summary today.  I hope you find this helpful.  I have sent a prescription for ibuprofen 600 mg to your pharmacy that you can take 3 times daily as needed for pain.  Please wear the splint that we provided for you today at all times except when you need to get your hands wet.  For the next week to 10 days, please monitor how well your pain is improving and the strength of your hand.  If you feel that you are not improving, I recommend that you follow-up with orthopedics.  More advanced imaging such as ultrasound or MRI may be indicated.  Thank you for visiting urgent care today.

## 2022-08-12 NOTE — ED Triage Notes (Signed)
The pt states he has limited movement to his right wrist with pain.  Home interventions: tylenol   Started: yesterday
